# Patient Record
Sex: Female | Born: 1980 | Race: Black or African American | Hispanic: No | Marital: Single | Smoking: Never smoker
Health system: Southern US, Community
[De-identification: ages and names within clinical notes are randomized; demographics above are authoritative.]

## PROBLEM LIST (undated history)

## (undated) DIAGNOSIS — K589 Irritable bowel syndrome without diarrhea: Secondary | ICD-10-CM

## (undated) DIAGNOSIS — E282 Polycystic ovarian syndrome: Secondary | ICD-10-CM

## (undated) DIAGNOSIS — N83209 Unspecified ovarian cyst, unspecified side: Secondary | ICD-10-CM

## (undated) DIAGNOSIS — E669 Obesity, unspecified: Secondary | ICD-10-CM

---

## 2008-10-06 ENCOUNTER — Emergency Department (HOSPITAL_COMMUNITY): Admission: EM | Admit: 2008-10-06 | Discharge: 2008-10-07 | Payer: Self-pay | Admitting: Emergency Medicine

## 2009-10-21 ENCOUNTER — Inpatient Hospital Stay (HOSPITAL_COMMUNITY): Admission: AD | Admit: 2009-10-21 | Discharge: 2009-10-21 | Payer: Self-pay | Admitting: Family Medicine

## 2010-11-30 LAB — CBC
HCT: 32.7 % — ABNORMAL LOW (ref 36.0–46.0)
Hemoglobin: 10.6 g/dL — ABNORMAL LOW (ref 12.0–15.0)
MCHC: 32.5 g/dL (ref 30.0–36.0)
MCV: 92.5 fL (ref 78.0–100.0)
Platelets: 333 10*3/uL (ref 150–400)
RBC: 3.53 MIL/uL — ABNORMAL LOW (ref 3.87–5.11)
RDW: 13.4 % (ref 11.5–15.5)
WBC: 7.5 10*3/uL (ref 4.0–10.5)

## 2010-11-30 LAB — WET PREP, GENITAL: Yeast Wet Prep HPF POC: NONE SEEN

## 2010-11-30 LAB — URINE MICROSCOPIC-ADD ON

## 2010-11-30 LAB — URINALYSIS, ROUTINE W REFLEX MICROSCOPIC
Bilirubin Urine: NEGATIVE
Ketones, ur: NEGATIVE mg/dL
Protein, ur: NEGATIVE mg/dL
Specific Gravity, Urine: 1.015 (ref 1.005–1.030)
Urobilinogen, UA: 0.2 mg/dL (ref 0.0–1.0)

## 2010-11-30 LAB — GC/CHLAMYDIA PROBE AMP, GENITAL: Chlamydia, DNA Probe: NEGATIVE

## 2010-12-25 LAB — DIFFERENTIAL
Eosinophils Relative: 1 % (ref 0–5)
Lymphocytes Relative: 34 % (ref 12–46)
Monocytes Absolute: 0.6 10*3/uL (ref 0.1–1.0)
Neutro Abs: 4.3 10*3/uL (ref 1.7–7.7)
Neutrophils Relative %: 57 % (ref 43–77)

## 2010-12-25 LAB — CBC
HCT: 35.1 % — ABNORMAL LOW (ref 36.0–46.0)
Platelets: 329 10*3/uL (ref 150–400)
RBC: 3.81 MIL/uL — ABNORMAL LOW (ref 3.87–5.11)

## 2010-12-25 LAB — URINALYSIS, ROUTINE W REFLEX MICROSCOPIC
Glucose, UA: NEGATIVE mg/dL
Hgb urine dipstick: NEGATIVE
Protein, ur: NEGATIVE mg/dL
pH: 5.5 (ref 5.0–8.0)

## 2010-12-25 LAB — POCT I-STAT, CHEM 8
BUN: 13 mg/dL (ref 6–23)
Glucose, Bld: 95 mg/dL (ref 70–99)
Potassium: 3.4 mEq/L — ABNORMAL LOW (ref 3.5–5.1)
Sodium: 141 mEq/L (ref 135–145)

## 2010-12-25 LAB — WET PREP, GENITAL

## 2010-12-25 LAB — URINE MICROSCOPIC-ADD ON

## 2010-12-25 LAB — GC/CHLAMYDIA PROBE AMP, GENITAL: Chlamydia, DNA Probe: NEGATIVE

## 2015-10-08 ENCOUNTER — Emergency Department (HOSPITAL_COMMUNITY): Payer: Medicaid Other

## 2015-10-08 ENCOUNTER — Emergency Department (HOSPITAL_COMMUNITY)
Admission: EM | Admit: 2015-10-08 | Discharge: 2015-10-08 | Disposition: A | Discharge status: Home / Self Care | Payer: Medicaid Other | Attending: Emergency Medicine | Admitting: Emergency Medicine

## 2015-10-08 ENCOUNTER — Encounter (HOSPITAL_COMMUNITY): Payer: Self-pay | Admitting: *Deleted

## 2015-10-08 DIAGNOSIS — N83202 Unspecified ovarian cyst, left side: Secondary | ICD-10-CM | POA: Diagnosis not present

## 2015-10-08 DIAGNOSIS — E669 Obesity, unspecified: Secondary | ICD-10-CM | POA: Insufficient documentation

## 2015-10-08 DIAGNOSIS — Z3202 Encounter for pregnancy test, result negative: Secondary | ICD-10-CM | POA: Insufficient documentation

## 2015-10-08 DIAGNOSIS — Z8719 Personal history of other diseases of the digestive system: Secondary | ICD-10-CM | POA: Insufficient documentation

## 2015-10-08 DIAGNOSIS — R1032 Left lower quadrant pain: Secondary | ICD-10-CM

## 2015-10-08 DIAGNOSIS — R1030 Lower abdominal pain, unspecified: Secondary | ICD-10-CM | POA: Diagnosis present

## 2015-10-08 HISTORY — DX: Obesity, unspecified: E66.9

## 2015-10-08 HISTORY — DX: Polycystic ovarian syndrome: E28.2

## 2015-10-08 HISTORY — DX: Unspecified ovarian cyst, unspecified side: N83.209

## 2015-10-08 HISTORY — DX: Irritable bowel syndrome, unspecified: K58.9

## 2015-10-08 LAB — CBC
HEMATOCRIT: 33.7 % — AB (ref 36.0–46.0)
HEMOGLOBIN: 11 g/dL — AB (ref 12.0–15.0)
MCH: 29.6 pg (ref 26.0–34.0)
MCHC: 32.6 g/dL (ref 30.0–36.0)
MCV: 90.8 fL (ref 78.0–100.0)
Platelets: 360 10*3/uL (ref 150–400)
RBC: 3.71 MIL/uL — AB (ref 3.87–5.11)
RDW: 13.4 % (ref 11.5–15.5)
WBC: 7.5 10*3/uL (ref 4.0–10.5)

## 2015-10-08 LAB — URINALYSIS, ROUTINE W REFLEX MICROSCOPIC
Bilirubin Urine: NEGATIVE
Glucose, UA: NEGATIVE mg/dL
HGB URINE DIPSTICK: NEGATIVE
Ketones, ur: NEGATIVE mg/dL
Leukocytes, UA: NEGATIVE
Nitrite: NEGATIVE
PH: 5.5 (ref 5.0–8.0)
Protein, ur: NEGATIVE mg/dL
SPECIFIC GRAVITY, URINE: 1.014 (ref 1.005–1.030)

## 2015-10-08 LAB — WET PREP, GENITAL
CLUE CELLS WET PREP: NONE SEEN
SPERM: NONE SEEN
TRICH WET PREP: NONE SEEN
Yeast Wet Prep HPF POC: NONE SEEN

## 2015-10-08 LAB — COMPREHENSIVE METABOLIC PANEL
ALBUMIN: 3.5 g/dL (ref 3.5–5.0)
ALK PHOS: 52 U/L (ref 38–126)
ALT: 10 U/L — ABNORMAL LOW (ref 14–54)
AST: 15 U/L (ref 15–41)
Anion gap: 6 (ref 5–15)
BILIRUBIN TOTAL: 0.3 mg/dL (ref 0.3–1.2)
BUN: 10 mg/dL (ref 6–20)
CALCIUM: 9.2 mg/dL (ref 8.9–10.3)
CO2: 25 mmol/L (ref 22–32)
CREATININE: 0.82 mg/dL (ref 0.44–1.00)
Chloride: 107 mmol/L (ref 101–111)
GFR calc Af Amer: >60 mL/min (ref >60)
GFR calc non Af Amer: >60 mL/min (ref >60)
GLUCOSE: 96 mg/dL (ref 65–99)
Potassium: 4.3 mmol/L (ref 3.5–5.1)
Sodium: 138 mmol/L (ref 135–145)
TOTAL PROTEIN: 6.6 g/dL (ref 6.5–8.1)

## 2015-10-08 LAB — I-STAT BETA HCG BLOOD, ED (MC, WL, AP ONLY): I-stat hCG, quantitative: <5 m[IU]/mL (ref <5)

## 2015-10-08 LAB — LIPASE, BLOOD: Lipase: 21 U/L (ref 11–51)

## 2015-10-08 MED ORDER — HYDROCODONE-ACETAMINOPHEN 5-325 MG PO TABS: 1.0000 | ORAL_TABLET | Freq: Four times a day (QID) | ORAL | Status: AC | PRN

## 2015-10-08 MED ORDER — KETOROLAC TROMETHAMINE 30 MG/ML IJ SOLN
30.0000 mg | Freq: Once | INTRAMUSCULAR | Status: AC
  Administered 2015-10-08: 30 mg via INTRAMUSCULAR
  Filled 2015-10-08: qty 1

## 2015-10-08 MED ORDER — OXYCODONE-ACETAMINOPHEN 5-325 MG PO TABS
1.0000 | ORAL_TABLET | Freq: Once | ORAL | Status: AC
  Administered 2015-10-08: 1 via ORAL

## 2015-10-08 MED ORDER — OXYCODONE-ACETAMINOPHEN 5-325 MG PO TABS
ORAL_TABLET | ORAL | Status: AC
  Filled 2015-10-08: qty 1

## 2015-10-08 NOTE — ED Provider Notes (Signed)
I received pt in sign out from Dr. Ethelda Chick, who reported that she had presented with left lower quadrant abdominal pain since yesterday which has been typical for ovarian cyst pain. Ultrasound was pending to rule out ovarian torsion. Ultrasound showed simple left ovarian cyst without any evidence of torsion. Gave the patient Toradol for pain. Discussed the [facility] discuss further treatment options for recurrent ovarian cysts. Instructed to take NSAIDs and provided with short course of Vicodin for severe pain at night. Return precautions reviewed and patient voiced understanding. Patient discharged in satisfactory condition.  [name], MD 10/08/15 [phone]

## 2015-10-08 NOTE — ED Notes (Signed)
EDP at bedside  

## 2015-10-08 NOTE — ED Provider Notes (Signed)
CSN: [id number]     Arrival date & time 10/08/15  1414 History   First MD Initiated Contact with Patient 10/08/15 1459     Chief Complaint  Patient presents with  . Abdominal Pain     (Consider location/radiation/quality/duration/timing/severity/associated sxs/prior Treatment) HPI Complaints of left-sided lower abdominal pain onset 11 PM yesterday. typical pain that she gets from ovarian cysts. Pain is nonradiating severe treatment prior to coming here no nausea or vomiting. Last bowel movement yesterday, normal last normal menstrual period one month ago. Pain is worse with sitting up or changed position improved with pressing on the area. No other associated symptoms no treatment prior to coming Past Medical History  Diagnosis Date  . Ovarian cyst   . Obesity   . Polycystic ovarian syndrome   . IBS (irritable bowel syndrome)    History reviewed. No pertinent past surgical history. History reviewed. No pertinent family history. Social History  Substance Use Topics  . Smoking status: Never Smoker   . Smokeless tobacco: None  . Alcohol Use: No   OB History    No data available     Review of Systems  Gastrointestinal: Positive for abdominal pain.  All other systems reviewed and are negative.     Allergies  Other  Home Medications   Prior to Admission medications   Not on File   BP 127/85 mmHg  Pulse 77  Temp(Src) 98.4 F (36.9 C) (Oral)  Resp 18  SpO2 100%  LMP 09/06/2015 Physical Exam  Constitutional: She appears well-developed and well-nourished. No distress.  HENT:  Head: Normocephalic and atraumatic.  Eyes: Conjunctivae are normal. Pupils are equal, round, and reactive to light.  Neck: Neck supple. No tracheal deviation present. No thyromegaly present.  Cardiovascular: Normal rate and regular rhythm.   No murmur heard. Pulmonary/Chest: Effort normal and breath sounds normal.  Abdominal: Soft. Bowel sounds are normal. She exhibits no distension and no  mass. There is tenderness. There is no rebound and no guarding.  Obese tender left lower quadrant  Musculoskeletal: Normal range of motion. She exhibits no edema or tenderness.  Neurological: She is alert. Coordination normal.  Skin: Skin is warm and dry. No rash noted.  Psychiatric: She has a normal mood and affect.  Nursing note and vitals reviewed.   ED Course  Procedures (including critical care time) Labs Review Labs Reviewed  CBC - Abnormal; Notable for the following:    RBC 3.71 (*)    Hemoglobin 11.0 (*)    HCT 33.7 (*)    All other components within normal limits  LIPASE, BLOOD  COMPREHENSIVE METABOLIC PANEL  URINALYSIS, ROUTINE W REFLEX MICROSCOPIC (NOT AT [facility])  I-STAT BETA HCG BLOOD, ED ([facility], [facility], [facility] ONLY)    Imaging Review No results found. I have personally reviewed and evaluated these images and lab results as part of my medical decision-making.   EKG Interpretation None     Patient signed out to Dr. Clarene Duke 4 p.m. Results for orders placed or performed during the hospital encounter of 10/08/15  Wet prep, genital  Result Value Ref Range   Yeast Wet Prep HPF POC NONE SEEN NONE SEEN   Trich, Wet Prep NONE SEEN NONE SEEN   Clue Cells Wet Prep HPF POC NONE SEEN NONE SEEN   WBC, Wet Prep HPF POC MANY (A) NONE SEEN   Sperm NONE SEEN   Lipase, blood  Result Value Ref Range   Lipase 21 11 - 51 U/L  Comprehensive metabolic panel  Result Value Ref Range   Sodium 138 135 - 145 mmol/L   Potassium 4.3 3.5 - 5.1 mmol/L   Chloride 107 101 - 111 mmol/L   CO2 25 22 - 32 mmol/L   Glucose, Bld 96 65 - 99 mg/dL   BUN 10 6 - 20 mg/dL   Creatinine, Ser 1.32 0.44 - 1.00 mg/dL   Calcium 9.2 8.9 - 44.0 mg/dL   Total Protein 6.6 6.5 - 8.1 g/dL   Albumin 3.5 3.5 - 5.0 g/dL   AST 15 15 - 41 U/L   ALT 10 (L) 14 - 54 U/L   Alkaline Phosphatase 52 38 - 126 U/L   Total Bilirubin 0.3 0.3 - 1.2 mg/dL   GFR calc non Af Amer >60 >60 mL/min   GFR calc Af Amer >60 >60 mL/min    Anion gap 6 5 - 15  CBC  Result Value Ref Range   WBC 7.5 4.0 - 10.5 K/uL   RBC 3.71 (L) 3.87 - 5.11 MIL/uL   Hemoglobin 11.0 (L) 12.0 - 15.0 g/dL   HCT 10.2 (L) 72.5 - 36.6 %   MCV 90.8 78.0 - 100.0 fL   MCH 29.6 26.0 - 34.0 pg   MCHC 32.6 30.0 - 36.0 g/dL   RDW 44.0 34.7 - 42.5 %   Platelets 360 150 - 400 K/uL  Urinalysis, Routine w reflex microscopic (not at [facility])  Result Value Ref Range   Color, Urine YELLOW YELLOW   APPearance CLEAR CLEAR   Specific Gravity, Urine 1.014 1.005 - 1.030   pH 5.5 5.0 - 8.0   Glucose, UA NEGATIVE NEGATIVE mg/dL   Hgb urine dipstick NEGATIVE NEGATIVE   Bilirubin Urine NEGATIVE NEGATIVE   Ketones, ur NEGATIVE NEGATIVE mg/dL   Protein, ur NEGATIVE NEGATIVE mg/dL   Nitrite NEGATIVE NEGATIVE   Leukocytes, UA NEGATIVE NEGATIVE  I-Stat beta hCG blood, ED ([facility], [facility], [facility] only)  Result Value Ref Range   I-stat hCG, quantitative <5.0 <5 mIU/mL   Comment 3           No results found.  MDM  Pelvic ultrasound pending Final diagnoses:  None  Dx abdominal pain      Doug Sou, MD 10/08/15 1708

## 2015-10-08 NOTE — ED Notes (Signed)
Pt reports LLQ pain since last night. Hx of ovarian cysts. Denies vomiting or diarrhea.

## 2015-10-09 LAB — HIV ANTIBODY (ROUTINE TESTING W REFLEX): HIV Screen 4th Generation wRfx: NONREACTIVE

## 2015-10-10 LAB — GC/CHLAMYDIA PROBE AMP (~~LOC~~) NOT AT ARMC
Chlamydia: NEGATIVE
Neisseria Gonorrhea: NEGATIVE

## 2015-10-14 ENCOUNTER — Inpatient Hospital Stay (HOSPITAL_COMMUNITY)
Admission: AD | Admit: 2015-10-14 | Discharge: 2015-10-15 | Disposition: A | Discharge status: Home / Self Care | Payer: Medicaid Other | Source: Ambulatory Visit | Attending: Obstetrics & Gynecology | Admitting: Obstetrics & Gynecology

## 2015-10-14 ENCOUNTER — Inpatient Hospital Stay (HOSPITAL_COMMUNITY): Payer: Medicaid Other

## 2015-10-14 ENCOUNTER — Encounter (HOSPITAL_COMMUNITY): Payer: Self-pay | Admitting: *Deleted

## 2015-10-14 DIAGNOSIS — N83202 Unspecified ovarian cyst, left side: Secondary | ICD-10-CM | POA: Insufficient documentation

## 2015-10-14 DIAGNOSIS — R1032 Left lower quadrant pain: Secondary | ICD-10-CM | POA: Diagnosis present

## 2015-10-14 LAB — CBC WITH DIFFERENTIAL/PLATELET
BASOS ABS: 0 10*3/uL (ref 0.0–0.1)
BASOS PCT: 0 %
EOS ABS: 0.1 10*3/uL (ref 0.0–0.7)
EOS PCT: 2 %
HCT: 33 % — ABNORMAL LOW (ref 36.0–46.0)
Hemoglobin: 10.9 g/dL — ABNORMAL LOW (ref 12.0–15.0)
LYMPHS PCT: 40 %
Lymphs Abs: 2.9 10*3/uL (ref 0.7–4.0)
MCH: 28.8 pg (ref 26.0–34.0)
MCHC: 33 g/dL (ref 30.0–36.0)
MCV: 87.3 fL (ref 78.0–100.0)
MONO ABS: 0.5 10*3/uL (ref 0.1–1.0)
Monocytes Relative: 7 %
Neutro Abs: 3.7 10*3/uL (ref 1.7–7.7)
Neutrophils Relative %: 51 %
PLATELETS: 381 10*3/uL (ref 150–400)
RBC: 3.78 MIL/uL — AB (ref 3.87–5.11)
RDW: 13.3 % (ref 11.5–15.5)
WBC: 7.3 10*3/uL (ref 4.0–10.5)

## 2015-10-14 LAB — COMPREHENSIVE METABOLIC PANEL
ALBUMIN: 4.2 g/dL (ref 3.5–5.0)
ALT: 10 U/L — AB (ref 14–54)
AST: 15 U/L (ref 15–41)
Alkaline Phosphatase: 51 U/L (ref 38–126)
Anion gap: 10 (ref 5–15)
BUN: 18 mg/dL (ref 6–20)
CALCIUM: 9.3 mg/dL (ref 8.9–10.3)
CHLORIDE: 103 mmol/L (ref 101–111)
CO2: 27 mmol/L (ref 22–32)
CREATININE: 1.29 mg/dL — AB (ref 0.44–1.00)
GFR calc Af Amer: >60 mL/min (ref >60)
GFR, EST NON AFRICAN AMERICAN: 53 mL/min — AB (ref >60)
GLUCOSE: 100 mg/dL — AB (ref 65–99)
POTASSIUM: 3.6 mmol/L (ref 3.5–5.1)
SODIUM: 140 mmol/L (ref 135–145)
Total Bilirubin: 0.6 mg/dL (ref 0.3–1.2)
Total Protein: 7.5 g/dL (ref 6.5–8.1)

## 2015-10-14 LAB — URINALYSIS, ROUTINE W REFLEX MICROSCOPIC
Bilirubin Urine: NEGATIVE
GLUCOSE, UA: NEGATIVE mg/dL
KETONES UR: NEGATIVE mg/dL
Leukocytes, UA: NEGATIVE
Nitrite: NEGATIVE
PROTEIN: NEGATIVE mg/dL
Specific Gravity, Urine: <1.005 — ABNORMAL LOW (ref 1.005–1.030)
pH: 5 (ref 5.0–8.0)

## 2015-10-14 LAB — URINE MICROSCOPIC-ADD ON

## 2015-10-14 LAB — AMYLASE: Amylase: 56 U/L (ref 28–100)

## 2015-10-14 LAB — LIPASE, BLOOD: Lipase: 24 U/L (ref 11–51)

## 2015-10-14 MED ORDER — DICYCLOMINE HCL 20 MG PO TABS
20.0000 mg | ORAL_TABLET | Freq: Once | ORAL | Status: AC
  Administered 2015-10-14: 20 mg via ORAL
  Filled 2015-10-14: qty 1

## 2015-10-14 MED ORDER — IOHEXOL 300 MG/ML  SOLN
100.0000 mL | Freq: Once | INTRAMUSCULAR | Status: AC | PRN
  Administered 2015-10-14: 100 mL via INTRAVENOUS

## 2015-10-14 MED ORDER — SODIUM CHLORIDE 0.9 % IV SOLN
12.5000 mg | Freq: Once | INTRAVENOUS | Status: AC
  Administered 2015-10-14: 12.5 mg via INTRAVENOUS
  Filled 2015-10-14: qty 0.5

## 2015-10-14 MED ORDER — SODIUM CHLORIDE 0.9 % IV SOLN: INTRAVENOUS | Status: DC

## 2015-10-14 MED ORDER — SODIUM CHLORIDE 0.9 % IV SOLN
INTRAVENOUS | Status: DC
  Administered 2015-10-14: 22:00:00 via INTRAVENOUS

## 2015-10-14 MED ORDER — IOHEXOL 300 MG/ML  SOLN
50.0000 mL | Freq: Once | INTRAMUSCULAR | Status: AC | PRN
  Administered 2015-10-14: 50 mL via ORAL

## 2015-10-14 MED ORDER — DICYCLOMINE HCL 10 MG PO CAPS
10.0000 mg | ORAL_CAPSULE | Freq: Once | ORAL | Status: AC
  Administered 2015-10-14: 10 mg via ORAL
  Filled 2015-10-14 (×2): qty 1

## 2015-10-14 NOTE — MAU Provider Note (Signed)
Chief Complaint:  Abdominal Pain   First Provider Initiated Contact with Patient 10/14/15 2048    HPI    Victoria Rosales is a 35 y.o. G1P1001 who presents to maternity admissions reporting severe left lower quadrant pain for .a couple of hours. Has had some LLQ pain since 1/28 and was diagnosed with a left 3cm simple ovarian cyst. Has been nauseated all week and drinking only smoothies.  Has not had a BM in a week until she ate a meal tonight which prompted an episode of diarrhea. Then pain got suddenly more severe. She thinks the cyst ruptured, but it seems to be associated with the diarrhea episode. Normal menses for her this week.  She reports vaginal bleeding (menses), no vaginal itching/burning, urinary symptoms, h/a, dizziness, n/v, or fever/chills.    RN Note: Pain started on the 28th and has been constant since then. Today she said the pain increased after her meal tonight. Also hard to pee and it burns        Past Medical History: Past Medical History  Diagnosis Date  . Ovarian cyst   . Obesity   . Polycystic ovarian syndrome   . IBS (irritable bowel syndrome)     Past obstetric history: OB History  Gravida Para Term Preterm AB SAB TAB Ectopic Multiple Living  1 1 1       1     # Outcome Date GA Lbr Len/2nd Weight Sex Delivery Anes PTL Lv  1 Term     F VBAC  Y Y      Past Surgical History: History reviewed. No pertinent past surgical history.  Family History: History reviewed. No pertinent family history.  Social History: Social History  Substance Use Topics  . Smoking status: Never Smoker   . Smokeless tobacco: None  . Alcohol Use: No    Allergies:  Allergies  Allergen Reactions  . Other     Onions and coconut    Meds:  Prescriptions prior to admission  Medication Sig Dispense Refill Last Dose  . HYDROcodone-acetaminophen (NORCO/VICODIN) 5-325 MG tablet Take 1 tablet by mouth every 6 (six) hours as needed for severe pain. 6 tablet 0 10/14/2015 at Unknown  time    I have reviewed patient's Past Medical Hx, Surgical Hx, Family Hx, Social Hx, medications and allergies.  ROS:  Review of Systems  Constitutional: Negative for fever, chills and fatigue.  Respiratory: Negative for shortness of breath.   Gastrointestinal: Positive for nausea, abdominal pain and diarrhea. Negative for vomiting, constipation and abdominal distention.  Genitourinary: Positive for vaginal bleeding and difficulty urinating. Negative for vaginal discharge and menstrual problem.  Musculoskeletal: Negative for back pain.  Neurological: Negative for weakness and light-headedness.  Psychiatric/Behavioral: Positive for agitation (writhing in pain).   Other systems negative  Physical Exam  Patient Vitals for the past 24 hrs:  BP Temp Temp src Pulse Resp  10/14/15 2017 151/91 mmHg 98.2 F (36.8 C) Axillary 110 20   Constitutional: Well-developed, well-nourished female in severe pain, writhing, holding LLQ  Cardiovascular: normal rate and rhythm, no ectopy audible, S1 & S2 heard, no murmur Respiratory: normal effort, no distress. Lungs CTAB with no wheezes or crackles GI: Abd soft, tender over LLQ.  Nondistended.  No rebound, No guarding.  Bowel Sounds audible  MS: Extremities nontender, no edema, normal ROM Neurologic: Alert and oriented x 4.   Grossly nonfocal. GU: Neg CVAT. Skin:  Warm and Dry Psych:  Affect appropriate. PELVIC EXAM: deferred due to recent exam and  extreme patient discomfort    Labs:    Results for orders placed or performed during the hospital encounter of 10/14/15 (from the past 24 hour(s))  CBC with Differential/Platelet     Status: Abnormal   Collection Time: 10/14/15  8:55 PM  Result Value Ref Range   WBC 7.3 4.0 - 10.5 K/uL   RBC 3.78 (L) 3.87 - 5.11 MIL/uL   Hemoglobin 10.9 (L) 12.0 - 15.0 g/dL   HCT 16.1 (L) 09.6 - 04.5 %   MCV 87.3 78.0 - 100.0 fL   MCH 28.8 26.0 - 34.0 pg   MCHC 33.0 30.0 - 36.0 g/dL   RDW 40.9 81.1 - 91.4 %    Platelets 381 150 - 400 K/uL   Neutrophils Relative % 51 %   Neutro Abs 3.7 1.7 - 7.7 K/uL   Lymphocytes Relative 40 %   Lymphs Abs 2.9 0.7 - 4.0 K/uL   Monocytes Relative 7 %   Monocytes Absolute 0.5 0.1 - 1.0 K/uL   Eosinophils Relative 2 %   Eosinophils Absolute 0.1 0.0 - 0.7 K/uL   Basophils Relative 0 %   Basophils Absolute 0.0 0.0 - 0.1 K/uL  Comprehensive metabolic panel     Status: Abnormal   Collection Time: 10/14/15  8:55 PM  Result Value Ref Range   Sodium 140 135 - 145 mmol/L   Potassium 3.6 3.5 - 5.1 mmol/L   Chloride 103 101 - 111 mmol/L   CO2 27 22 - 32 mmol/L   Glucose, Bld 100 (H) 65 - 99 mg/dL   BUN 18 6 - 20 mg/dL   Creatinine, Ser 7.82 (H) 0.44 - 1.00 mg/dL   Calcium 9.3 8.9 - 95.6 mg/dL   Total Protein 7.5 6.5 - 8.1 g/dL   Albumin 4.2 3.5 - 5.0 g/dL   AST 15 15 - 41 U/L   ALT 10 (L) 14 - 54 U/L   Alkaline Phosphatase 51 38 - 126 U/L   Total Bilirubin 0.6 0.3 - 1.2 mg/dL   GFR calc non Af Amer 53 (L) >60 mL/min   GFR calc Af Amer >60 >60 mL/min   Anion gap 10 5 - 15  Amylase     Status: None   Collection Time: 10/14/15  8:55 PM  Result Value Ref Range   Amylase 56 28 - 100 U/L  Lipase, blood     Status: None   Collection Time: 10/14/15  8:55 PM  Result Value Ref Range   Lipase 24 11 - 51 U/L     Imaging:  US Transvaginal Non-ob  10/08/2015  CLINICAL DATA:  Patient with left lower quadrant pain since last night. EXAM: TRANSABDOMINAL AND TRANSVAGINAL ULTRASOUND OF PELVIS DOPPLER ULTRASOUND OF OVARIES TECHNIQUE: Both transabdominal and transvaginal ultrasound examinations of the pelvis were performed. Transabdominal technique was performed for global imaging of the pelvis including uterus, ovaries, adnexal regions, and pelvic cul-de-sac. It was necessary to proceed with endovaginal exam following the transabdominal exam to visualize the adnexal structures. Color and duplex Doppler ultrasound was utilized to evaluate blood flow to the ovaries. COMPARISON:   Pelvic ultrasound 10/21/2009 FINDINGS: Uterus Measurements: 8.7 x 4.5 x 4.5 cm. No fibroids or other mass visualized. Endometrium Thickness: 5 mm.  No focal abnormality visualized. Right ovary Measurements: 3.7 x 1.7 x 2.3 cm. Normal appearance/no adnexal mass. Left ovary Measurements: 4.8 x 3.0 x 3.6 cm. Within the left ovary there is a 3.3 x 2.7 x 2.7 cm simple cyst. Pulsed Doppler evaluation of both  ovaries demonstrates normal low-resistance arterial and venous waveforms. Other findings Trace pelvic free fluid. IMPRESSION: There is a 3.3 cm simple cyst within the left ovary. No sonographic evidence to suggest ovarian torsion. Electronically Signed   By: Annia Belt M.D.   On: 10/08/2015 18:24   US Pelvis Complete  10/08/2015  CLINICAL DATA:  Patient with left lower quadrant pain since last night. EXAM: TRANSABDOMINAL AND TRANSVAGINAL ULTRASOUND OF PELVIS DOPPLER ULTRASOUND OF OVARIES TECHNIQUE: Both transabdominal and transvaginal ultrasound examinations of the pelvis were performed. Transabdominal technique was performed for global imaging of the pelvis including uterus, ovaries, adnexal regions, and pelvic cul-de-sac. It was necessary to proceed with endovaginal exam following the transabdominal exam to visualize the adnexal structures. Color and duplex Doppler ultrasound was utilized to evaluate blood flow to the ovaries. COMPARISON:  Pelvic ultrasound 10/21/2009 FINDINGS: Uterus Measurements: 8.7 x 4.5 x 4.5 cm. No fibroids or other mass visualized. Endometrium Thickness: 5 mm.  No focal abnormality visualized. Right ovary Measurements: 3.7 x 1.7 x 2.3 cm. Normal appearance/no adnexal mass. Left ovary Measurements: 4.8 x 3.0 x 3.6 cm. Within the left ovary there is a 3.3 x 2.7 x 2.7 cm simple cyst. Pulsed Doppler evaluation of both ovaries demonstrates normal low-resistance arterial and venous waveforms. Other findings Trace pelvic free fluid. IMPRESSION: There is a 3.3 cm simple cyst within the left  ovary. No sonographic evidence to suggest ovarian torsion. Electronically Signed   By: Annia Belt M.D.   On: 10/08/2015 18:24   [place] Art/ven Flow Abd Pelv Doppler  10/08/2015  CLINICAL DATA:  Patient with left lower quadrant pain since last night. EXAM: TRANSABDOMINAL AND TRANSVAGINAL ULTRASOUND OF PELVIS DOPPLER ULTRASOUND OF OVARIES TECHNIQUE: Both transabdominal and transvaginal ultrasound examinations of the pelvis were performed. Transabdominal technique was performed for global imaging of the pelvis including uterus, ovaries, adnexal regions, and pelvic cul-de-sac. It was necessary to proceed with endovaginal exam following the transabdominal exam to visualize the adnexal structures. Color and duplex Doppler ultrasound was utilized to evaluate blood flow to the ovaries. COMPARISON:  Pelvic ultrasound 10/21/2009 FINDINGS: Uterus Measurements: 8.7 x 4.5 x 4.5 cm. No fibroids or other mass visualized. Endometrium Thickness: 5 mm.  No focal abnormality visualized. Right ovary Measurements: 3.7 x 1.7 x 2.3 cm. Normal appearance/no adnexal mass. Left ovary Measurements: 4.8 x 3.0 x 3.6 cm. Within the left ovary there is a 3.3 x 2.7 x 2.7 cm simple cyst. Pulsed Doppler evaluation of both ovaries demonstrates normal low-resistance arterial and venous waveforms. Other findings Trace pelvic free fluid. IMPRESSION: There is a 3.3 cm simple cyst within the left ovary. No sonographic evidence to suggest ovarian torsion. Electronically Signed   By: Annia Belt M.D.   On: 10/08/2015 18:24   US Transvaginal Non-ob  10/08/2015  CLINICAL DATA:  Patient with left lower quadrant pain since last night. EXAM: TRANSABDOMINAL AND TRANSVAGINAL ULTRASOUND OF PELVIS DOPPLER ULTRASOUND OF OVARIES TECHNIQUE: Both transabdominal and transvaginal ultrasound examinations of the pelvis were performed. Transabdominal technique was performed for global imaging of the pelvis including uterus, ovaries, adnexal regions, and pelvic cul-de-sac.  It was necessary to proceed with endovaginal exam following the transabdominal exam to visualize the adnexal structures. Color and duplex Doppler ultrasound was utilized to evaluate blood flow to the ovaries. COMPARISON:  Pelvic ultrasound 10/21/2009 FINDINGS: Uterus Measurements: 8.7 x 4.5 x 4.5 cm. No fibroids or other mass visualized. Endometrium Thickness: 5 mm.  No focal abnormality visualized. Right ovary Measurements: 3.7 x 1.7 x  2.3 cm. Normal appearance/no adnexal mass. Left ovary Measurements: 4.8 x 3.0 x 3.6 cm. Within the left ovary there is a 3.3 x 2.7 x 2.7 cm simple cyst. Pulsed Doppler evaluation of both ovaries demonstrates normal low-resistance arterial and venous waveforms. Other findings Trace pelvic free fluid. IMPRESSION: There is a 3.3 cm simple cyst within the left ovary. No sonographic evidence to suggest ovarian torsion. Electronically Signed   By: Annia Belt M.D.   On: 10/08/2015 18:24   US Pelvis Complete  10/08/2015  CLINICAL DATA:  Patient with left lower quadrant pain since last night. EXAM: TRANSABDOMINAL AND TRANSVAGINAL ULTRASOUND OF PELVIS DOPPLER ULTRASOUND OF OVARIES TECHNIQUE: Both transabdominal and transvaginal ultrasound examinations of the pelvis were performed. Transabdominal technique was performed for global imaging of the pelvis including uterus, ovaries, adnexal regions, and pelvic cul-de-sac. It was necessary to proceed with endovaginal exam following the transabdominal exam to visualize the adnexal structures. Color and duplex Doppler ultrasound was utilized to evaluate blood flow to the ovaries. COMPARISON:  Pelvic ultrasound 10/21/2009 FINDINGS: Uterus Measurements: 8.7 x 4.5 x 4.5 cm. No fibroids or other mass visualized. Endometrium Thickness: 5 mm.  No focal abnormality visualized. Right ovary Measurements: 3.7 x 1.7 x 2.3 cm. Normal appearance/no adnexal mass. Left ovary Measurements: 4.8 x 3.0 x 3.6 cm. Within the left ovary there is a 3.3 x 2.7 x 2.7 cm  simple cyst. Pulsed Doppler evaluation of both ovaries demonstrates normal low-resistance arterial and venous waveforms. Other findings Trace pelvic free fluid. IMPRESSION: There is a 3.3 cm simple cyst within the left ovary. No sonographic evidence to suggest ovarian torsion. Electronically Signed   By: Annia Belt M.D.   On: 10/08/2015 18:24   Ct Abdomen Pelvis W Contrast  10/14/2015  CLINICAL DATA:  Severe left lower quadrant pain. Diarrhea. Left ovarian cyst on recent ultrasound. EXAM: CT ABDOMEN AND PELVIS WITH CONTRAST TECHNIQUE: Multidetector CT imaging of the abdomen and pelvis was performed using the standard protocol following bolus administration of intravenous contrast. CONTRAST:  OMNIPAQUE IOHEXOL 300 MG/ML SOLN, 50mL OMNIPAQUE IOHEXOL 300 MG/ML SOLN COMPARISON:  Pelvic ultrasound 10/09/2015 FINDINGS: Lower chest:  The included lung bases are clear. Liver: Appears prominent in size, no focal lesion. Hepatobiliary: Gallbladder is decompressed, no calcified stone. No biliary dilatation. Pancreas: No ductal dilatation or inflammation. Spleen: Normal. Adrenal glands: No nodule. Kidneys: Symmetric renal enhancement. No hydronephrosis. No perinephric stranding. Stomach/Bowel: Stomach physiologically distended. There are no dilated or thickened small bowel loops. Small volume of stool throughout the colon without colonic wall thickening. The appendix is not confidently identified, no pericecal or right lower quadrant inflammatory change. Vascular/Lymphatic: No retroperitoneal adenopathy. Abdominal aorta is normal in caliber. Reproductive: Uterus is unremarkable. Left ovarian cyst measuring 3 cm is again seen. No medial deviation as can be seen with torsion. Small amount of simple free fluid in the cul-de-sac. Right ovary is normal in size. Bladder: Physiologically distended without wall thickening. Other: No free air, abdominal ascites, or intra-abdominal fluid collection. Musculoskeletal: There are no  acute or suspicious osseous abnormalities. IMPRESSION: Unchanged 3 cm left ovarian cyst. Otherwise no acute abnormality to explain left lower quadrant pain. Electronically Signed   By: Rubye Oaks M.D.   On: 10/14/2015 23:22   [place] Art/ven Flow Abd Pelv Doppler  10/08/2015  CLINICAL DATA:  Patient with left lower quadrant pain since last night. EXAM: TRANSABDOMINAL AND TRANSVAGINAL ULTRASOUND OF PELVIS DOPPLER ULTRASOUND OF OVARIES TECHNIQUE: Both transabdominal and transvaginal ultrasound examinations of the pelvis  were performed. Transabdominal technique was performed for global imaging of the pelvis including uterus, ovaries, adnexal regions, and pelvic cul-de-sac. It was necessary to proceed with endovaginal exam following the transabdominal exam to visualize the adnexal structures. Color and duplex Doppler ultrasound was utilized to evaluate blood flow to the ovaries. COMPARISON:  Pelvic ultrasound 10/21/2009 FINDINGS: Uterus Measurements: 8.7 x 4.5 x 4.5 cm. No fibroids or other mass visualized. Endometrium Thickness: 5 mm.  No focal abnormality visualized. Right ovary Measurements: 3.7 x 1.7 x 2.3 cm. Normal appearance/no adnexal mass. Left ovary Measurements: 4.8 x 3.0 x 3.6 cm. Within the left ovary there is a 3.3 x 2.7 x 2.7 cm simple cyst. Pulsed Doppler evaluation of both ovaries demonstrates normal low-resistance arterial and venous waveforms. Other findings Trace pelvic free fluid. IMPRESSION: There is a 3.3 cm simple cyst within the left ovary. No sonographic evidence to suggest ovarian torsion. Electronically Signed   By: Annia Belt M.D.   On: 10/08/2015 18:24    MAU Course/MDM: I have ordered labs as follows:CBC, CMET, amylase, lipase, UA Imaging ordered: Abd/pelvic CT per recommendation of Dr Despina Hidden..   Treatments in MAU included Bentyl for intestinal cramping as possible source of pain. After receiving the Bentyl, her pain improved. She states "it kind of just spread out".  Was able to  talk on phone and is no longer in fetal position. Was able to walk to BR. .   Will get Abd/Pelvic CT for confirmation of source of pain  Assessment: Severe left lower quadrant pain Recent 3cm simple cyst left ovary Recent loose stool followed by severe pain  Plan: Report given to oncoming CNM    Medication List    ASK your doctor about these medications        HYDROcodone-acetaminophen 5-325 MG tablet  Commonly known as:  NORCO/VICODIN  Take 1 tablet by mouth every 6 (six) hours as needed for severe pain.       Encouraged to return here or to other Urgent Care/ED if she develops worsening of symptoms, increase in pain, fever, or other concerning symptoms.   Wynelle Bourgeois CNM, MSN Certified Nurse-Midwife 10/14/2015 9:19 PM

## 2015-10-14 NOTE — MAU Note (Addendum)
Pain started on the 28th and has been constant since then. Today she said the pain increased after her meal tonight. Also hard to pee and it burns

## 2015-10-15 DIAGNOSIS — N83202 Unspecified ovarian cyst, left side: Secondary | ICD-10-CM | POA: Diagnosis not present

## 2015-10-15 MED ORDER — PROMETHAZINE HCL 25 MG PO TABS: 25.0000 mg | ORAL_TABLET | Freq: Four times a day (QID) | ORAL | Status: AC | PRN

## 2015-10-15 MED ORDER — DICYCLOMINE HCL 20 MG PO TABS: 20.0000 mg | ORAL_TABLET | Freq: Three times a day (TID) | ORAL | Status: AC | PRN

## 2015-10-15 NOTE — Discharge Instructions (Signed)
Ovarian Cyst An ovarian cyst is a fluid-filled sac that forms on an ovary. The ovaries are small organs that produce eggs in women. Various types of cysts can form on the ovaries. Most are not cancerous. Many do not cause problems, and they often go away on their own. Some may cause symptoms and require treatment. Common types of ovarian cysts include:  Functional cysts--These cysts may occur every month during the menstrual cycle. This is normal. The cysts usually go away with the next menstrual cycle if the woman does not get pregnant. Usually, there are no symptoms with a functional cyst.  Endometrioma cysts--These cysts form from the tissue that lines the uterus. They are also called "chocolate cysts" because they become filled with blood that turns brown. This type of cyst can cause pain in the lower abdomen during intercourse and with your menstrual period.  Cystadenoma cysts--This type develops from the cells on the outside of the ovary. These cysts can get very big and cause lower abdomen pain and pain with intercourse. This type of cyst can twist on itself, cut off its blood supply, and cause severe pain. It can also easily rupture and cause a lot of pain.  Dermoid cysts--This type of cyst is sometimes found in both ovaries. These cysts may contain different kinds of body tissue, such as skin, teeth, hair, or cartilage. They usually do not cause symptoms unless they get very big.  Theca lutein cysts--These cysts occur when too much of a certain hormone (human chorionic gonadotropin) is produced and overstimulates the ovaries to produce an egg. This is most common after procedures used to assist with the conception of a baby (in vitro fertilization). CAUSES   Fertility drugs can cause a condition in which multiple large cysts are formed on the ovaries. This is called ovarian hyperstimulation syndrome.  A condition called polycystic ovary syndrome can cause hormonal imbalances that can lead to  nonfunctional ovarian cysts. SIGNS AND SYMPTOMS  Many ovarian cysts do not cause symptoms. If symptoms are present, they may include:  Pelvic pain or pressure.  Pain in the lower abdomen.  Pain during sexual intercourse.  Increasing girth (swelling) of the abdomen.  Abnormal menstrual periods.  Increasing pain with menstrual periods.  Stopping having menstrual periods without being pregnant. DIAGNOSIS  These cysts are commonly found during a routine or annual pelvic exam. Tests may be ordered to find out more about the cyst. These tests may include:  Ultrasound.  X-ray of the pelvis.  CT scan.  MRI.  Blood tests. TREATMENT  Many ovarian cysts go away on their own without treatment. Your health care provider may want to check your cyst regularly for 2-3 months to see if it changes. For women in menopause, it is particularly important to monitor a cyst closely because of the higher rate of ovarian cancer in menopausal women. When treatment is needed, it may include any of the following:  A procedure to drain the cyst (aspiration). This may be done using a long needle and ultrasound. It can also be done through a laparoscopic procedure. This involves using a thin, lighted tube with a tiny camera on the end (laparoscope) inserted through a small incision.  Surgery to remove the whole cyst. This may be done using laparoscopic surgery or an open surgery involving a larger incision in the lower abdomen.  Hormone treatment or birth control pills. These methods are sometimes used to help dissolve a cyst. HOME CARE INSTRUCTIONS   Only take over-the-counter   or prescription medicines as directed by your health care provider.  Follow up with your health care provider as directed.  Get regular pelvic exams and Pap tests. SEEK MEDICAL CARE IF:   Your periods are late, irregular, or painful, or they stop.  Your pelvic pain or abdominal pain does not go away.  Your abdomen becomes  larger or swollen.  You have pressure on your bladder or trouble emptying your bladder completely.  You have pain during sexual intercourse.  You have feelings of fullness, pressure, or discomfort in your stomach.  You lose weight for no apparent reason.  You feel generally ill.  You become constipated.  You lose your appetite.  You develop acne.  You have an increase in body and facial hair.  You are gaining weight, without changing your exercise and eating habits.  You think you are pregnant. SEEK IMMEDIATE MEDICAL CARE IF:   You have increasing abdominal pain.  You feel sick to your stomach (nauseous), and you throw up (vomit).  You develop a fever that comes on suddenly.  You have abdominal pain during a bowel movement.  Your menstrual periods become heavier than usual. MAKE SURE YOU:  Understand these instructions.  Will watch your condition.  Will get help right away if you are not doing well or get worse.   This information is not intended to replace advice given to you by your health care provider. Make sure you discuss any questions you have with your health care provider.   Document Released: 08/27/2005 Document Revised: 09/01/2013 Document Reviewed: 05/04/2013 [employer] Interactive Patient Education 2016 [employer]  

## 2015-10-28 ENCOUNTER — Encounter: Payer: Medicaid Other | Admitting: Obstetrics & Gynecology

## 2015-11-25 ENCOUNTER — Encounter: Payer: Medicaid Other | Admitting: Obstetrics & Gynecology

## 2017-01-02 IMAGING — CT CT ABD-PELV W/ CM
1 of 3 series · 15 of 32 positions shown, 19 images · IV contrast (OMNIPAQUE)
Comparison: Pelvic ultrasound 10/09/2015

CLINICAL DATA: Severe left lower quadrant pain. Diarrhea. Left
ovarian cyst on recent ultrasound.

EXAM:
CT ABDOMEN AND PELVIS WITH CONTRAST
TECHNIQUE: Multidetector CT imaging of the abdomen and pelvis was performed
using the standard protocol following bolus administration of
intravenous contrast.
CONTRAST:  100mL OMNIPAQUE IOHEXOL 300 MG/ML SOLN, 50mL OMNIPAQUE
IOHEXOL 300 MG/ML SOLN

[Series 3: (person_name) thins · axial · 0.61mm/px · z∈[+519,+933]mm · 15 of 642 slices shown, 19 images]
[im 25/642  soft-tissue]
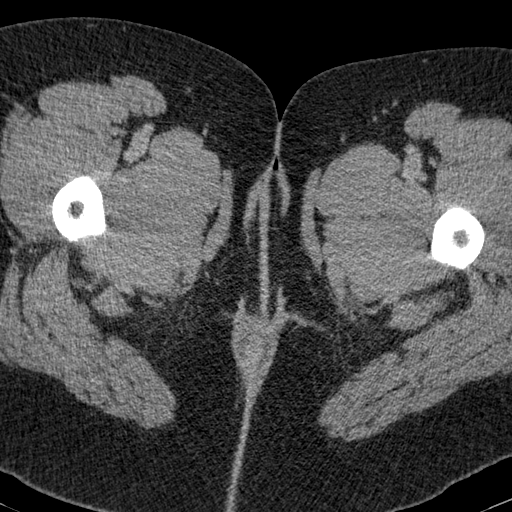
[im 25/642  bone]
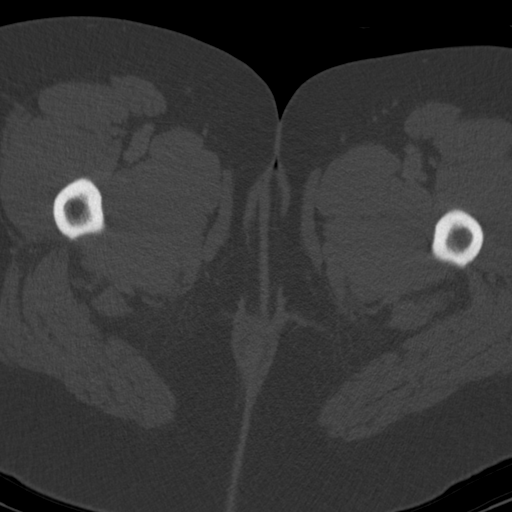
[im 74/642  soft-tissue]
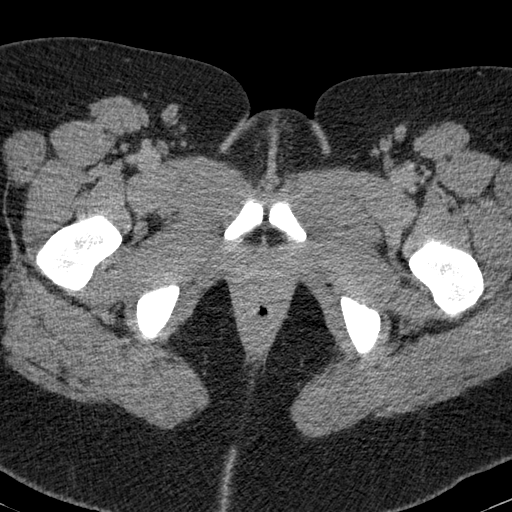
[im 124/642  soft-tissue]
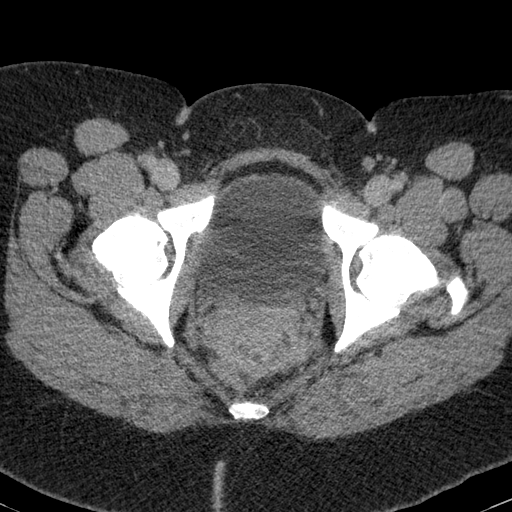
[im 173/642  soft-tissue]
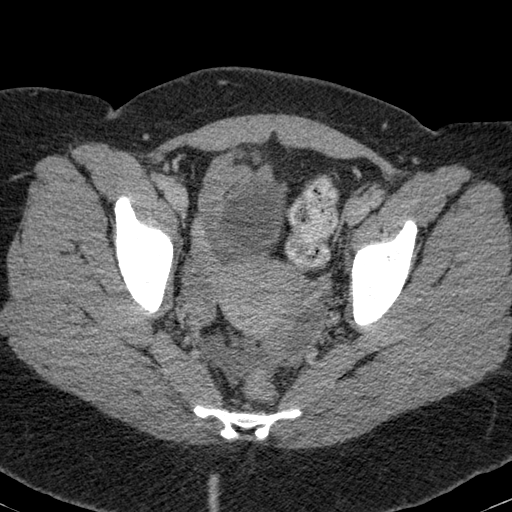
[im 222/642  soft-tissue]
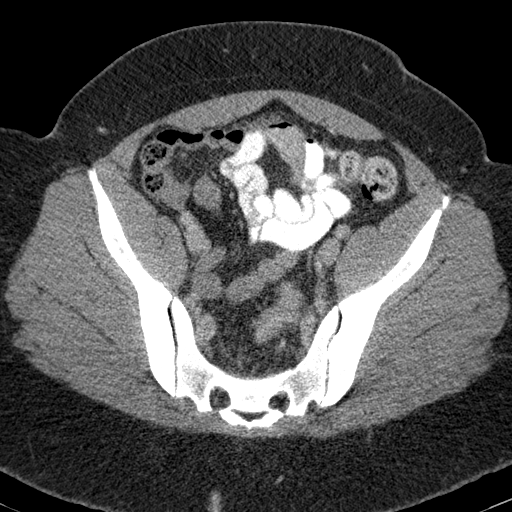
[im 272/642  soft-tissue]
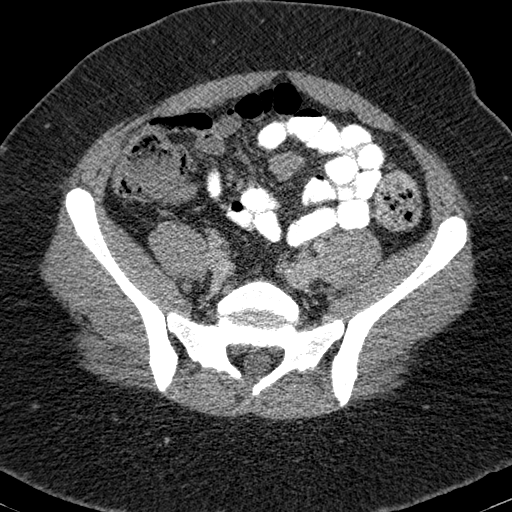
[im 321/642  soft-tissue]
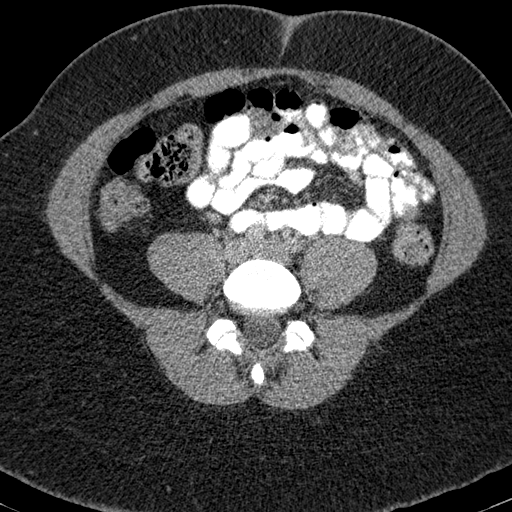
[im 370/642  soft-tissue]
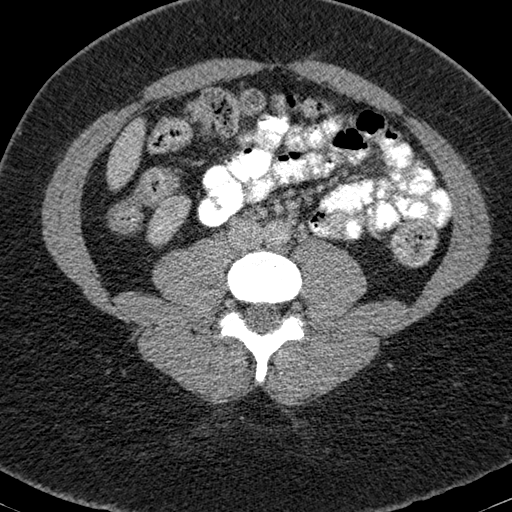
[im 420/642  soft-tissue]
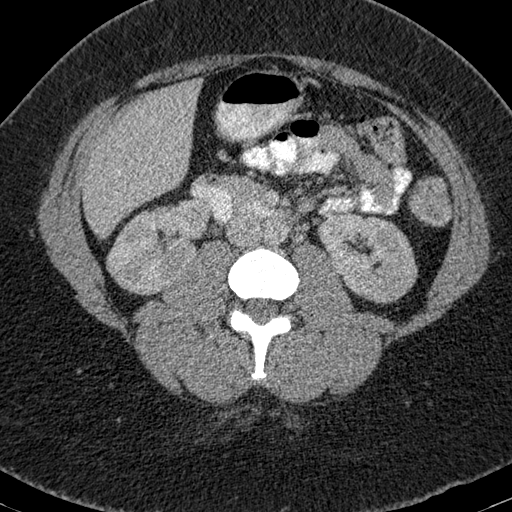
[im 420/642  bone]
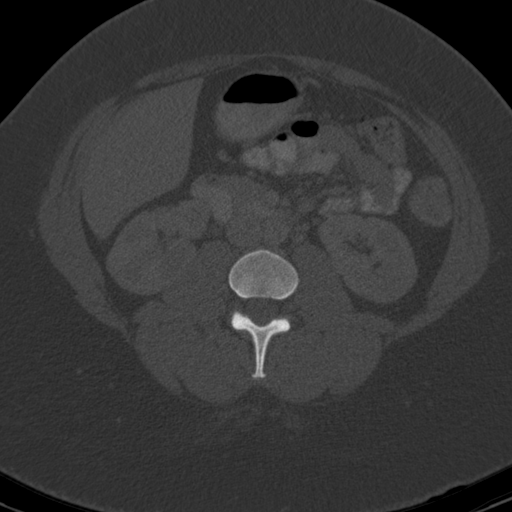
[im 469/642  soft-tissue]
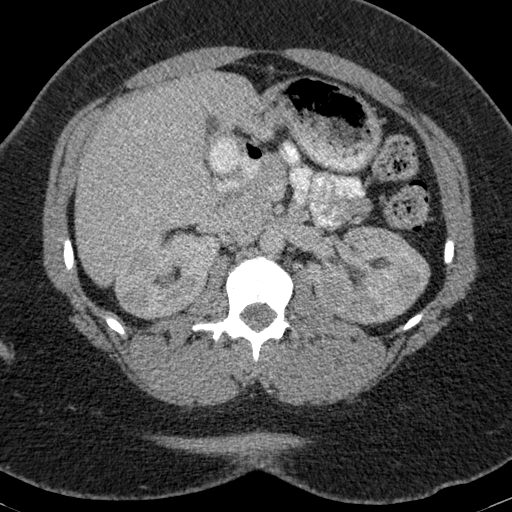
[im 518/642  soft-tissue]
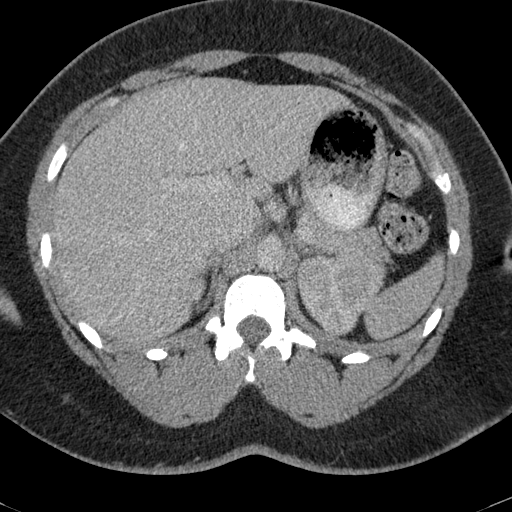
[im 543/642  lung]
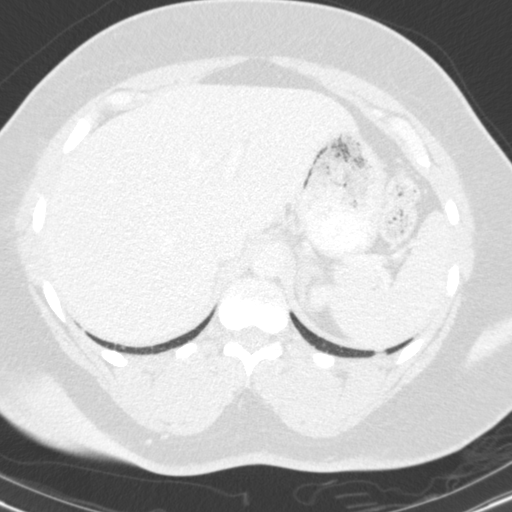
[im 568/642  soft-tissue]
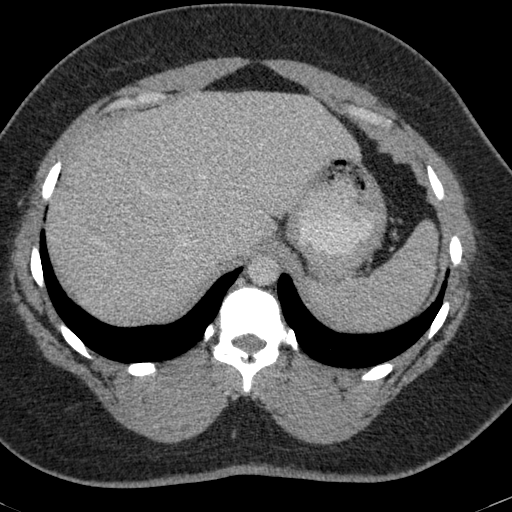
[im 568/642  lung]
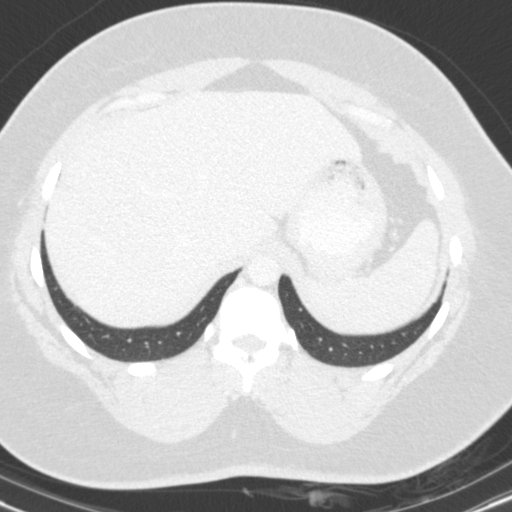
[im 592/642  lung]
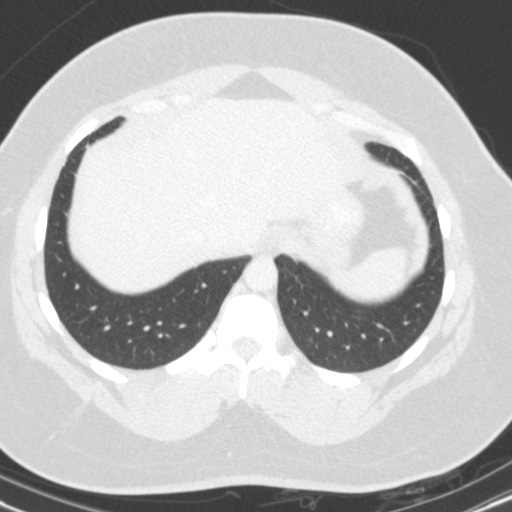
[im 617/642  soft-tissue]
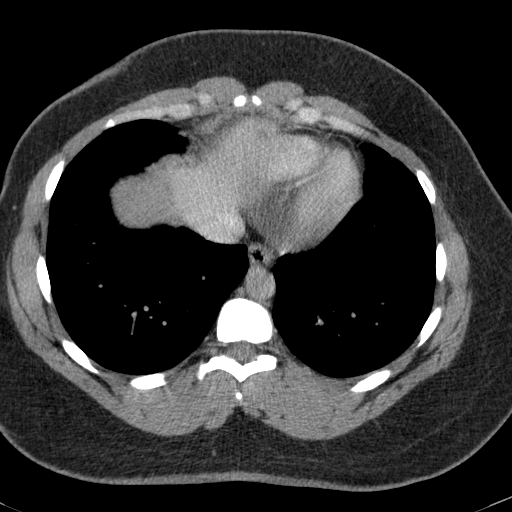
[im 617/642  lung]
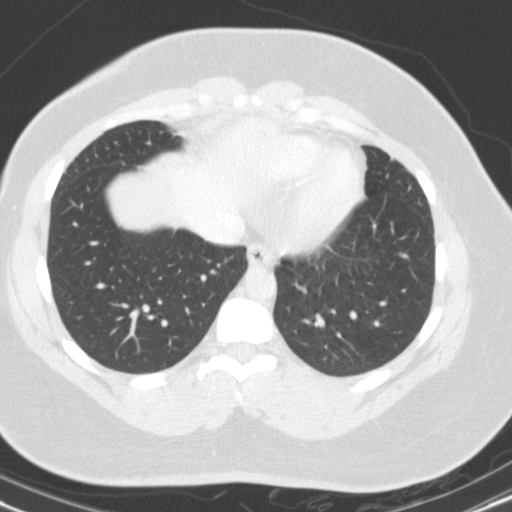

[15 of 32 positions shown; findings below may reference images not displayed]

FINDINGS: Lower chest:  The included lung bases are clear.

Liver: Appears prominent in size, no focal lesion.

Hepatobiliary: Gallbladder is decompressed, no calcified stone. No
biliary dilatation.

Pancreas: No ductal dilatation or inflammation.

Spleen: Normal.

Adrenal glands: No nodule.

Kidneys: Symmetric renal enhancement. No hydronephrosis. No
perinephric stranding.

Stomach/Bowel: Stomach physiologically distended. There are no
dilated or thickened small bowel loops. Small volume of stool
throughout the colon without colonic wall thickening. The appendix
is not confidently identified, no pericecal or right lower quadrant
inflammatory change.

Vascular/Lymphatic: No retroperitoneal adenopathy. Abdominal aorta
is normal in caliber.

Reproductive: Uterus is unremarkable. Left ovarian cyst measuring 3
cm is again seen. No medial deviation as can be seen with torsion.
Small amount of simple free fluid in the cul-de-sac. Right ovary is
normal in size.

Bladder: Physiologically distended without wall thickening.

Other: No free air, abdominal ascites, or intra-abdominal fluid
collection.

Musculoskeletal: There are no acute or suspicious osseous
abnormalities.
IMPRESSION: Unchanged 3 cm left ovarian cyst. Otherwise no acute abnormality to
explain left lower quadrant pain.
